# Patient Record
Sex: Female | Born: 1995 | Race: Black or African American | Hispanic: No | Marital: Single | Smoking: Former smoker
Health system: Southern US, Community
[De-identification: ages and names within clinical notes are randomized; demographics above are authoritative.]

---

## 2017-04-14 ENCOUNTER — Emergency Department
Admission: EM | Admit: 2017-04-14 | Discharge: 2017-04-14 | Disposition: A | Discharge status: Home / Self Care | Payer: Medicaid Other | Attending: Emergency Medicine | Admitting: Emergency Medicine

## 2017-04-14 ENCOUNTER — Encounter: Payer: Self-pay | Admitting: Emergency Medicine

## 2017-04-14 DIAGNOSIS — R05 Cough: Secondary | ICD-10-CM | POA: Diagnosis present

## 2017-04-14 DIAGNOSIS — Z87891 Personal history of nicotine dependence: Secondary | ICD-10-CM | POA: Insufficient documentation

## 2017-04-14 DIAGNOSIS — J209 Acute bronchitis, unspecified: Secondary | ICD-10-CM

## 2017-04-14 MED ORDER — AZITHROMYCIN 250 MG PO TABS: ORAL_TABLET | ORAL | 0 refills | Status: AC

## 2017-04-14 MED ORDER — IPRATROPIUM-ALBUTEROL 0.5-2.5 (3) MG/3ML IN SOLN
RESPIRATORY_TRACT | Status: AC
  Filled 2017-04-14: qty 3

## 2017-04-14 MED ORDER — PREDNISONE 10 MG (21) PO TBPK: ORAL_TABLET | Freq: Every day | ORAL | 0 refills | Status: AC

## 2017-04-14 MED ORDER — IPRATROPIUM-ALBUTEROL 0.5-2.5 (3) MG/3ML IN SOLN
3.0000 mL | Freq: Once | RESPIRATORY_TRACT | Status: AC
  Administered 2017-04-14: 3 mL via RESPIRATORY_TRACT

## 2017-04-14 MED ORDER — ALBUTEROL SULFATE HFA 108 (90 BASE) MCG/ACT IN AERS: 2.0000 | INHALATION_SPRAY | Freq: Four times a day (QID) | RESPIRATORY_TRACT | 2 refills | Status: AC | PRN

## 2017-04-14 NOTE — ED Triage Notes (Signed)
Pt to ed with c/o cough x 3 days, worsening today.  Pt denies fever.

## 2017-04-14 NOTE — ED Provider Notes (Signed)
[facility] Emergency Department Provider Note       Time seen: ----------------------------------------- 12:32 PM on 04/14/2017 -----------------------------------------     I have reviewed the triage vital signs and the nursing notes.   HISTORY   Chief Complaint Cough    HPI Tracy Gaines is a 21 y.o. female who presents to the ED for worsening cough for the last 3 days with shortness of breath. Patient states she feels like she is having a hard time breathing. She denies fevers or chills, head congestion with sputum production and cough. She states she had childhood asthma but denies inhaler use in the past.   History reviewed. No pertinent past medical history.  There are no active problems to display for this patient.   History reviewed. No pertinent surgical history.  Allergies Patient has no known allergies.  Social History Social History  Substance Use Topics  . Smoking status: Former Games developer  . Smokeless tobacco: Never Used  . Alcohol use Yes    Review of Systems Constitutional: Negative for fever. Eyes: Negative for vision changes ENT:  Positive for congestion Cardiovascular: Negative for chest pain. Respiratory: Positive for shortness of breath and cough Gastrointestinal: Negative for abdominal pain, vomiting and diarrhea. Genitourinary: Negative for dysuria. Musculoskeletal: Negative for back pain. Skin: Negative for rash. Neurological: Negative for headaches, focal weakness or numbness.  All systems negative/normal/unremarkable except as stated in the HPI  ____________________________________________   PHYSICAL EXAM:  VITAL SIGNS: ED Triage Vitals [04/14/17 1147]  Enc Vitals Group     BP (!) 142/81     Pulse Rate 83     Resp 18     Temp 97.5 F (36.4 C)     Temp Source Oral     SpO2 97 %     Weight 206 lb (93.4 kg)     Height 5\' 3"  (1.6 m)     Head Circumference      Peak Flow      Pain Score 0     Pain  Loc      Pain Edu?      Excl. in GC?     Constitutional: Alert and oriented. Well appearing and in no distress. Eyes: Conjunctivae are normal. Normal extraocular movements. ENT   Head: Normocephalic and atraumatic.   Nose: No congestion/rhinnorhea.   Mouth/Throat: Mucous membranes are moist.   Neck: No stridor. Cardiovascular: Normal rate, regular rhythm. No murmurs, rubs, or gallops. Respiratory: Normal respiratory effort without tachypnea nor retractions. Mild expiratory wheezing on the right Gastrointestinal: Soft and nontender. Normal bowel sounds Musculoskeletal: Nontender with normal range of motion in extremities. No lower extremity tenderness nor edema. Neurologic:  Normal speech and language. No gross focal neurologic deficits are appreciated.  Skin:  Skin is warm, dry and intact. No rash noted. Psychiatric: Mood and affect are normal. Speech and behavior are normal.  ___________________________________________  ED COURSE:  Pertinent labs & imaging results that were available during my care of the patient were reviewed by me and considered in my medical decision making (see chart for details). Patient presents for cough with bronchospasm, we will assess with labs and imaging as indicated.   Procedures ____________________________________________  FINAL ASSESSMENT AND PLAN  Cough, bronchospasm  Plan: Patient had presented for cough and wheezing. We gave her a DuoNeb which improved her breathing. She'll be discharged with albuterol and steroids. Symptoms are likely viral, advised if the cough is no better in a week to start antibiotics.   Daryel November  E, MD   Note: This note was generated in part or whole with voice recognition software. Voice recognition is usually quite accurate but there are transcription errors that can and very often do occur. I apologize for any typographical errors that were not detected and corrected.     Emily FilbertWilliams, Jonathan  E, MD 04/14/17 [phone]

## 2017-04-14 NOTE — ED Notes (Signed)
Pt states cough, chills, SOB x few days. States she might be congested. Has not checked temp at home. Alert, oriented, ambulatory. No distress noted.

## 2017-09-21 ENCOUNTER — Emergency Department
Admission: EM | Admit: 2017-09-21 | Discharge: 2017-09-21 | Disposition: A | Discharge status: Home / Self Care | Payer: Self-pay | Attending: Emergency Medicine | Admitting: Emergency Medicine

## 2017-09-21 ENCOUNTER — Emergency Department: Payer: Self-pay

## 2017-09-21 DIAGNOSIS — R1031 Right lower quadrant pain: Secondary | ICD-10-CM | POA: Insufficient documentation

## 2017-09-21 DIAGNOSIS — Z79899 Other long term (current) drug therapy: Secondary | ICD-10-CM | POA: Insufficient documentation

## 2017-09-21 DIAGNOSIS — Z87891 Personal history of nicotine dependence: Secondary | ICD-10-CM | POA: Insufficient documentation

## 2017-09-21 DIAGNOSIS — R197 Diarrhea, unspecified: Secondary | ICD-10-CM | POA: Insufficient documentation

## 2017-09-21 DIAGNOSIS — R1030 Lower abdominal pain, unspecified: Secondary | ICD-10-CM

## 2017-09-21 LAB — URINALYSIS, COMPLETE (UACMP) WITH MICROSCOPIC
Bacteria, UA: NONE SEEN
Bilirubin Urine: NEGATIVE
GLUCOSE, UA: NEGATIVE mg/dL
Ketones, ur: NEGATIVE mg/dL
NITRITE: NEGATIVE
PH: 5 (ref 5.0–8.0)
Protein, ur: 30 mg/dL — AB
SPECIFIC GRAVITY, URINE: 1.023 (ref 1.005–1.030)

## 2017-09-21 LAB — COMPREHENSIVE METABOLIC PANEL
ALK PHOS: 46 U/L (ref 38–126)
ALT: 20 U/L (ref 14–54)
AST: 21 U/L (ref 15–41)
Albumin: 4.2 g/dL (ref 3.5–5.0)
Anion gap: 6 (ref 5–15)
BILIRUBIN TOTAL: 0.9 mg/dL (ref 0.3–1.2)
BUN: 11 mg/dL (ref 6–20)
CALCIUM: 8.9 mg/dL (ref 8.9–10.3)
CO2: 24 mmol/L (ref 22–32)
CREATININE: 0.74 mg/dL (ref 0.44–1.00)
Chloride: 108 mmol/L (ref 101–111)
GFR calc Af Amer: >60 mL/min (ref >60)
Glucose, Bld: 98 mg/dL (ref 65–99)
Potassium: 3.7 mmol/L (ref 3.5–5.1)
SODIUM: 138 mmol/L (ref 135–145)
TOTAL PROTEIN: 7 g/dL (ref 6.5–8.1)

## 2017-09-21 LAB — CBC
HCT: 38.6 % (ref 35.0–47.0)
Hemoglobin: 12.9 g/dL (ref 12.0–16.0)
MCH: 26.1 pg (ref 26.0–34.0)
MCHC: 33.4 g/dL (ref 32.0–36.0)
MCV: 78.3 fL — ABNORMAL LOW (ref 80.0–100.0)
PLATELETS: 210 10*3/uL (ref 150–440)
RBC: 4.94 MIL/uL (ref 3.80–5.20)
RDW: 13.6 % (ref 11.5–14.5)
WBC: 11.7 10*3/uL — ABNORMAL HIGH (ref 3.6–11.0)

## 2017-09-21 LAB — POCT PREGNANCY, URINE: Preg Test, Ur: NEGATIVE

## 2017-09-21 LAB — LIPASE, BLOOD: Lipase: 21 U/L (ref 11–51)

## 2017-09-21 MED ORDER — ONDANSETRON HCL 4 MG PO TABS: 4.0000 mg | ORAL_TABLET | Freq: Three times a day (TID) | ORAL | 0 refills | Status: AC | PRN

## 2017-09-21 MED ORDER — ONDANSETRON HCL 4 MG/2ML IJ SOLN
4.0000 mg | Freq: Once | INTRAMUSCULAR | Status: AC
  Administered 2017-09-21: 4 mg via INTRAVENOUS
  Filled 2017-09-21: qty 2

## 2017-09-21 MED ORDER — KETOROLAC TROMETHAMINE 30 MG/ML IJ SOLN
15.0000 mg | Freq: Once | INTRAMUSCULAR | Status: AC
  Administered 2017-09-21: 15 mg via INTRAVENOUS
  Filled 2017-09-21: qty 1

## 2017-09-21 MED ORDER — IOPAMIDOL (ISOVUE-300) INJECTION 61%
100.0000 mL | Freq: Once | INTRAVENOUS | Status: AC | PRN
  Administered 2017-09-21: 100 mL via INTRAVENOUS
  Filled 2017-09-21: qty 100

## 2017-09-21 NOTE — ED Triage Notes (Signed)
Lower abdominal cramping and intermittent sharp pain that began last night. Nausea and diarrhea X 2. Pt alert and oriented X4, active, cooperative, pt in NAD. RR even and unlabored, color WNL.

## 2017-09-21 NOTE — ED Triage Notes (Signed)
FIRST NURSE NOTE-offered wheel chair, declined. C/o abdominal pain. No distress, appears to be hurting.

## 2017-09-21 NOTE — ED Provider Notes (Signed)
[facility] Emergency Department Provider Note  ____________________________________________  Time seen: Approximately 12:42 PM  I have reviewed the triage vital signs and the nursing notes.   HISTORY  Chief Complaint Abdominal Pain   HPI Tracy Gaines is a 21 y.o. female no significant past medical history who presents for evaluation of abdominal pain. Patient is complaining of lower abdominal pain that she describes as crampy and sharp, constant, worse on the right lower quadrant that started last night. She has had nausea and 3 episodes of watery diarrhea. No vomiting, no melena, no hematochezia, no fever or chills. Patient denies any prior abdominal surgeries. Her pain is currently moderate. No dysuria or hematuria, no vaginal discharge.  History reviewed. No pertinent past medical history.  There are no active problems to display for this patient.   History reviewed. No pertinent surgical history.  Prior to Admission medications   Medication Sig Start Date End Date Taking? Authorizing Provider  albuterol (PROVENTIL HFA;VENTOLIN HFA) 108 (90 Base) MCG/ACT inhaler Inhale 2 puffs into the lungs every 6 (six) hours as needed for wheezing or shortness of breath. 04/14/17   Emily Filbert, MD  azithromycin (ZITHROMAX Z-PAK) 250 MG tablet Take 2 tablets (500 mg) on  Day 1,  followed by 1 tablet (250 mg) once daily on Days 2 through 5.  Only take this if you are still coughing in a week 04/14/17   Emily Filbert, MD  ondansetron (ZOFRAN) 4 MG tablet Take 1 tablet (4 mg total) by mouth every 8 (eight) hours as needed for nausea or vomiting. 09/21/17   Don Perking, [place], MD  predniSONE (STERAPRED UNI-PAK 21 TAB) 10 MG (21) TBPK tablet Take by mouth daily. Dispense steroid taper pack as directed 04/14/17   Emily Filbert, MD    Allergies Patient has no known allergies.  No family history on file.  Social History Social History  Substance  Use Topics  . Smoking status: Former Games developer  . Smokeless tobacco: Never Used  . Alcohol use Yes    Review of Systems  Constitutional: Negative for fever. Eyes: Negative for visual changes. ENT: Negative for sore throat. Neck: No neck pain  Cardiovascular: Negative for chest pain. Respiratory: Negative for shortness of breath. Gastrointestinal: + lower abdominal pain, nausea, and diarrhea. No vomiting  Genitourinary: Negative for dysuria. Musculoskeletal: Negative for back pain. Skin: Negative for rash. Neurological: Negative for headaches, weakness or numbness. Psych: No SI or HI  ____________________________________________   PHYSICAL EXAM:  VITAL SIGNS: ED Triage Vitals  Enc Vitals Group     BP 09/21/17 0914 (!) 133/43     Pulse Rate 09/21/17 0914 89     Resp 09/21/17 0914 16     Temp 09/21/17 0914 99.2 F (37.3 C)     Temp Source 09/21/17 0914 Oral     SpO2 09/21/17 0914 95 %     Weight 09/21/17 0915 210 lb (95.3 kg)     Height 09/21/17 0915 5\' 3"  (1.6 m)     Head Circumference --      Peak Flow --      Pain Score 09/21/17 0914 6     Pain Loc --      Pain Edu? --      Excl. in GC? --     Constitutional: Alert and oriented. Well appearing and in no apparent distress. HEENT:      Head: Normocephalic and atraumatic.         Eyes: Conjunctivae are  normal. Sclera is non-icteric.       Mouth/Throat: Mucous membranes are moist.       Neck: Supple with no signs of meningismus. Cardiovascular: Regular rate and rhythm. No murmurs, gallops, or rubs. 2+ symmetrical distal pulses are present in all extremities. No JVD. Respiratory: Normal respiratory effort. Lungs are clear to auscultation bilaterally. No wheezes, crackles, or rhonchi.  Gastrointestinal: Soft, patient is ttp over all lower quadrants but more pronounced in the RLQ, and non distended with positive bowel sounds. No rebound or guarding. Musculoskeletal: Nontender with normal range of motion in all  extremities. No edema, cyanosis, or erythema of extremities. Neurologic: Normal speech and language. Face is symmetric. Moving all extremities. No gross focal neurologic deficits are appreciated. Skin: Skin is warm, dry and intact. No rash noted. Psychiatric: Mood and affect are normal. Speech and behavior are normal.  ____________________________________________   LABS (all labs ordered are listed, but only abnormal results are displayed)  Labs Reviewed  CBC - Abnormal; Notable for the following:       Result Value   WBC 11.7 (*)    MCV 78.3 (*)    All other components within normal limits  URINALYSIS, COMPLETE (UACMP) WITH MICROSCOPIC - Abnormal; Notable for the following:    Color, Urine YELLOW (*)    APPearance HAZY (*)    Hgb urine dipstick LARGE (*)    Protein, ur 30 (*)    Leukocytes, UA MODERATE (*)    Squamous Epithelial / LPF 0-5 (*)    All other components within normal limits  URINE CULTURE  LIPASE, BLOOD  COMPREHENSIVE METABOLIC PANEL  POC URINE PREG, ED  POCT PREGNANCY, URINE   ____________________________________________  EKG  none  ____________________________________________  RADIOLOGY  CT a/p: 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 No other abnormality seen in the abdomen or pelvis. ____________________________________________   PROCEDURES  Procedure(s) performed: None Procedures Critical Care performed:  None ____________________________________________   INITIAL IMPRESSION / ASSESSMENT AND PLAN / ED COURSE  21 y.o. female no significant past medical history who presents for evaluation of abdominal pain, nausea, and diarrhea since last night. Patient is extremely well appearing, no distress, vital signs are within normal limits with a temp of 99.61F. labs show slight leukocytosis with white count of 11.7. CMP, lipase, pregnancy are all negative. UA has WBCs and  leukocytes but no nitrites or bacteria. Patient has no vaginal discharge or dysuria so will not treat at this time and will send urine for culture. She is tender diffusely in the lower quadrants however since her ttp is more pronounced in the RLQ, she has leukocytosis and low grade temp, I will send her for CT to rule out appendicitis. Since patient has diarrhea this could also be gastroenteritis or colitis.     _________________________ 1:04 PM on 09/21/2017 -----------------------------------------  CT showing pelvic congestion syndrome but no evidence of appendicitis, colitis, or diverticulitis. Pain has resolved with IV Toradol. Abdomen remains soft and benign now with no tenderness. Presentation concerning for most likely viral gastritis. We'll discharge home with Zofran, close follow-up with PCP and discussed return precautions.   As part of my medical decision making, I reviewed the following data within the electronic MEDICAL RECORD NUMBER Nursing notes reviewed and incorporated, Labs reviewed , Radiograph reviewed , Notes from prior ED visits and [place] Controlled Substance Database    Pertinent labs & imaging results that were available during my care of the  patient were reviewed by me and considered in my medical decision making (see chart for details).    ____________________________________________   FINAL CLINICAL IMPRESSION(S) / ED DIAGNOSES  Final diagnoses:  Lower abdominal pain  Diarrhea of presumed infectious origin      NEW MEDICATIONS STARTED DURING THIS VISIT:  New Prescriptions   ONDANSETRON (ZOFRAN) 4 MG TABLET    Take 1 tablet (4 mg total) by mouth every 8 (eight) hours as needed for nausea or vomiting.     Note:  This document was prepared using Dragon voice recognition software and may include unintentional dictation errors.    Nita Sickle, MD 09/21/17 [phone]

## 2017-09-21 NOTE — ED Notes (Signed)
Pt verbalized understanding of discharge instructions. NAD at this time. 

## 2017-09-21 NOTE — Discharge Instructions (Signed)

## 2017-09-23 LAB — URINE CULTURE

## 2018-08-25 IMAGING — CT CT ABD-PELV W/ CM
2 of 4 series · 16 of 46 positions shown, 18 images · IV contrast (iopamidol)
Comparison: None.

CLINICAL DATA: Acute lower abdominal pain.

EXAM:
CT ABDOMEN AND PELVIS WITH CONTRAST
TECHNIQUE: Multidetector CT imaging of the abdomen and pelvis was performed
using the standard protocol following bolus administration of
intravenous contrast.
CONTRAST:  100mL 0ZXHI7-HCC IOPAMIDOL (0ZXHI7-HCC) INJECTION 61%

[Series 2: axial st · axial · 0.76mm/px · z∈[-971,-546]mm · 13 of 93 slices shown, 15 images]
[im 4/93  soft-tissue]
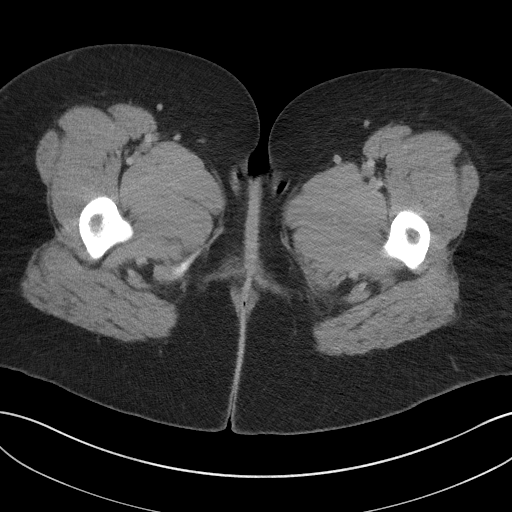
[im 4/93  bone]
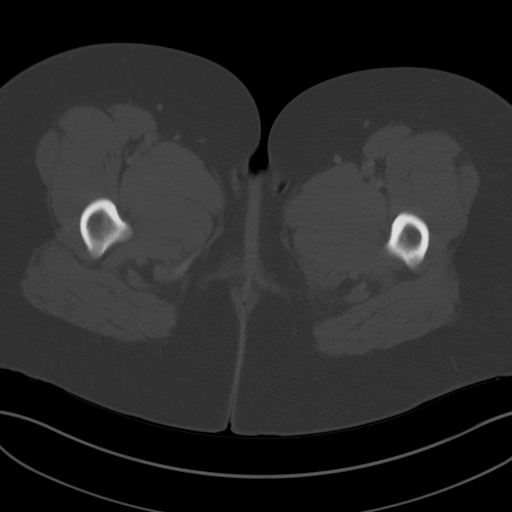
[im 12/93  soft-tissue]
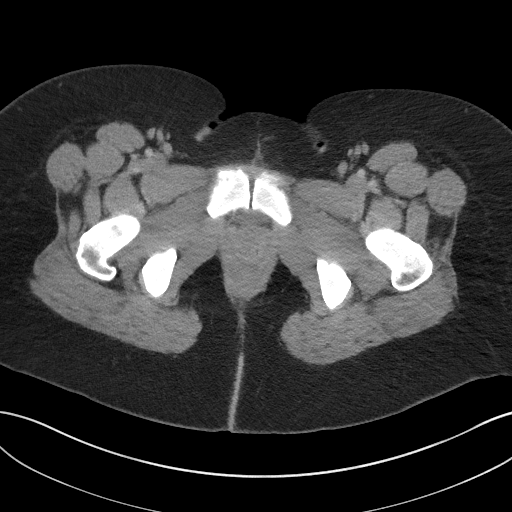
[im 20/93  soft-tissue]
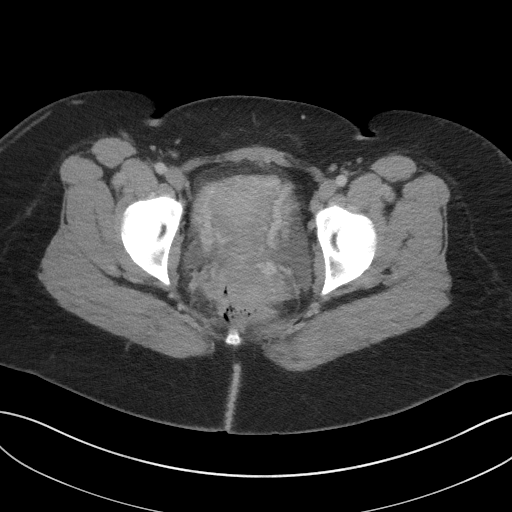
[im 27/93  soft-tissue]
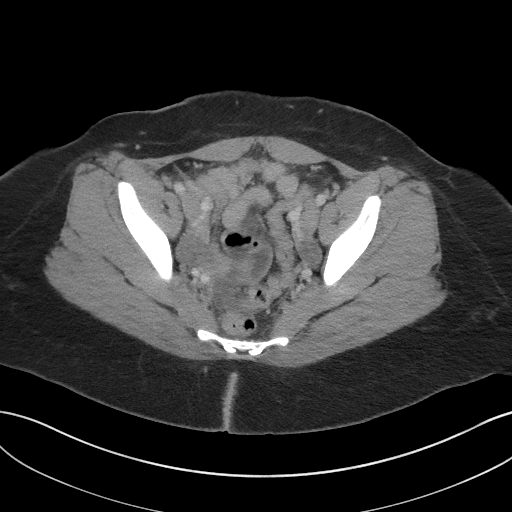
[im 31/93  soft-tissue]
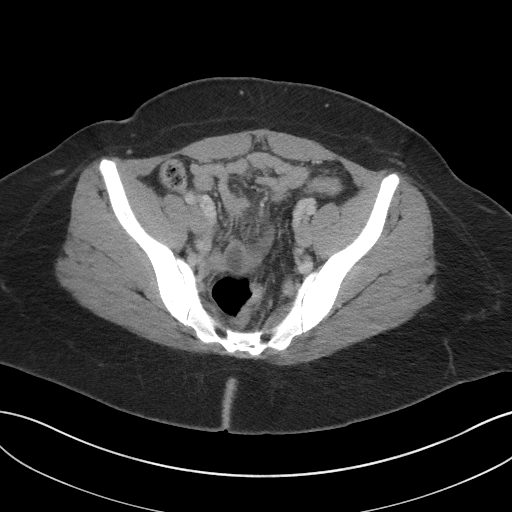
[im 39/93  soft-tissue]
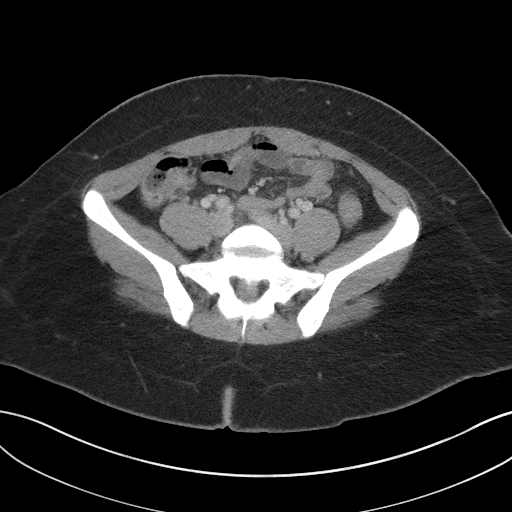
[im 47/93  soft-tissue]
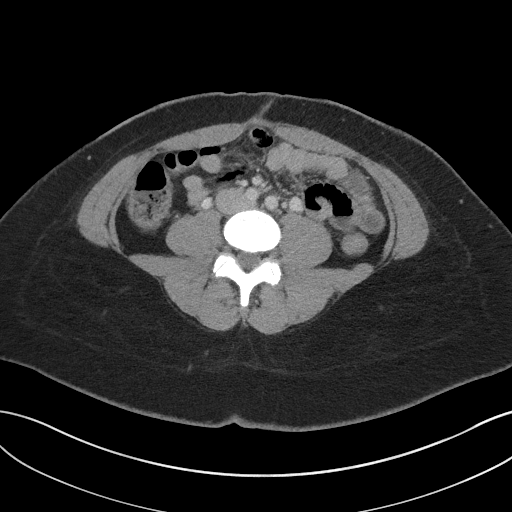
[im 54/93  soft-tissue]
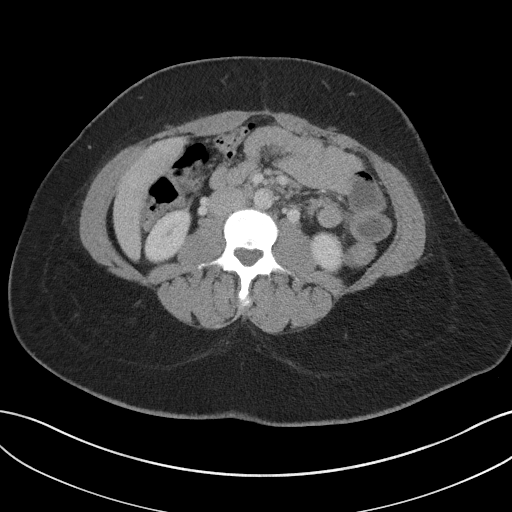
[im 62/93  soft-tissue]
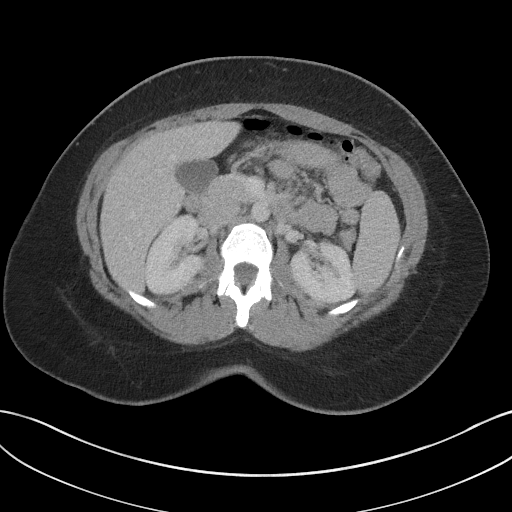
[im 62/93  bone]
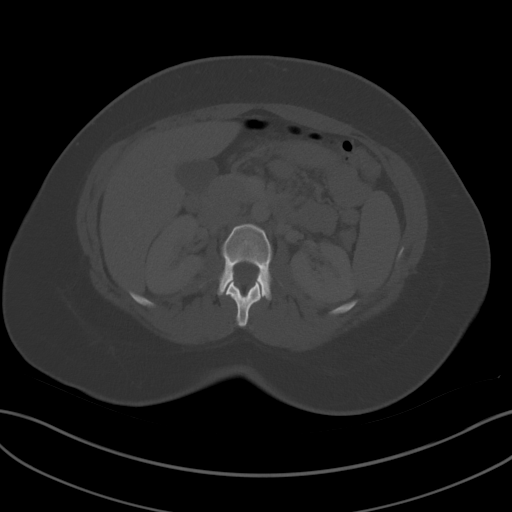
[im 66/93  soft-tissue]
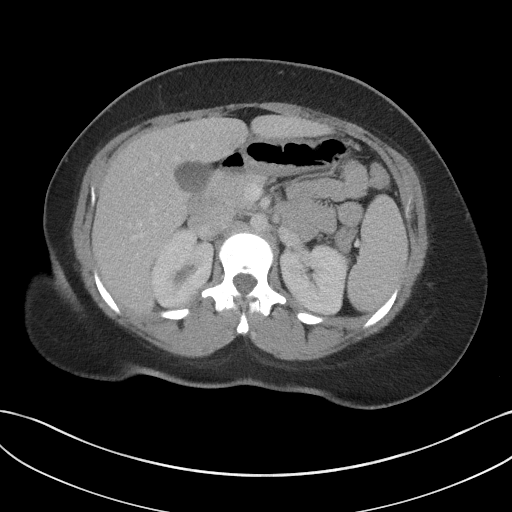
[im 73/93  soft-tissue]
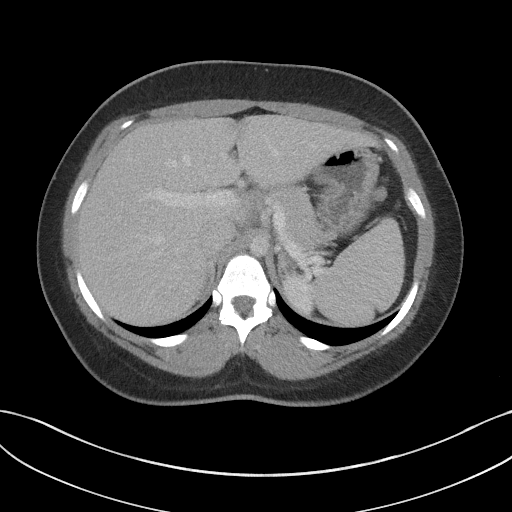
[im 81/93  soft-tissue]
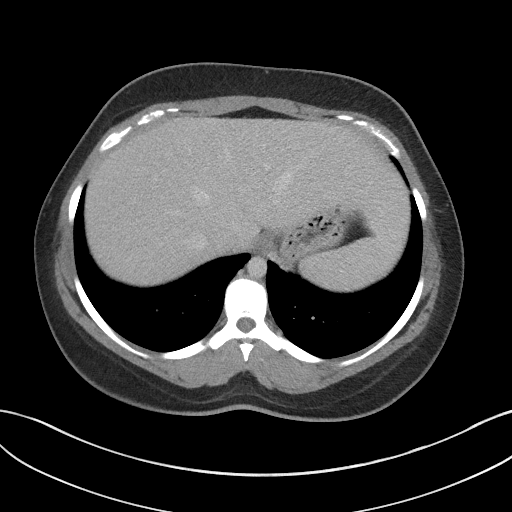
[im 89/93  soft-tissue]
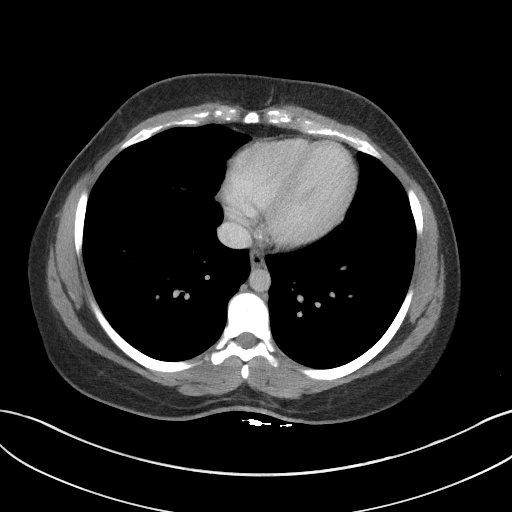

[Series 5: coronal st · coronal · 0.72mm/px · 3 of 77 slices shown]
[im 26/77  soft-tissue]
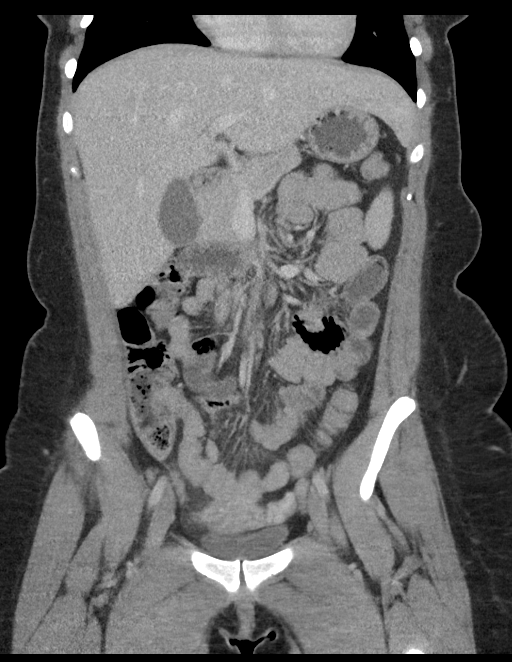
[im 34/77  soft-tissue]
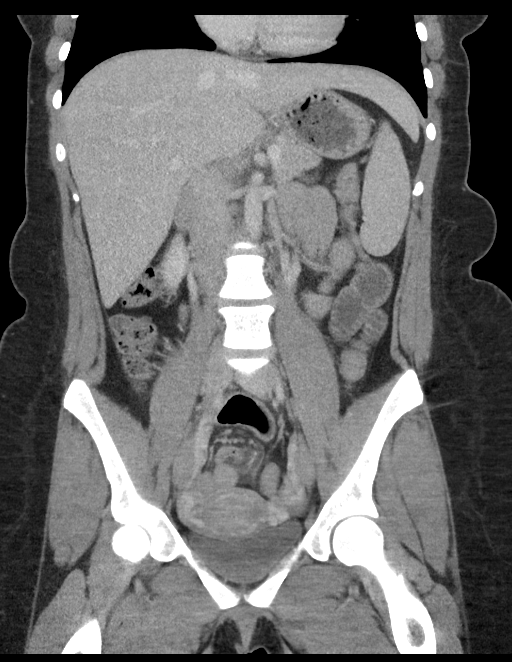
[im 43/77  soft-tissue]
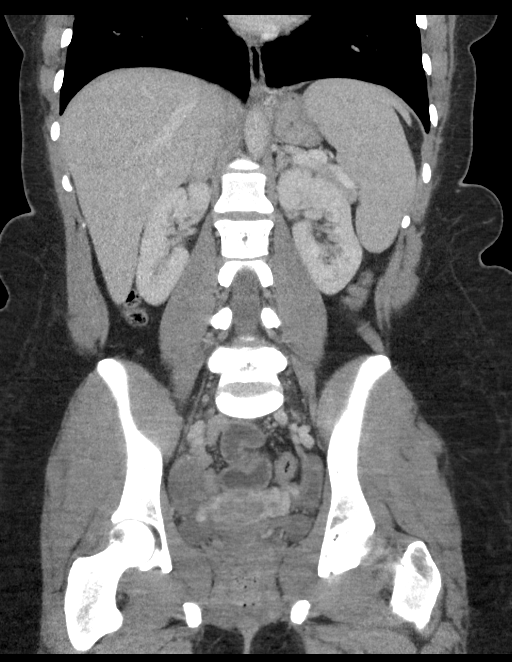

[16 of 46 positions shown; findings below may reference images not displayed]

FINDINGS: Lower chest: No acute abnormality.

Hepatobiliary: No focal liver abnormality is seen. No gallstones,
gallbladder wall thickening, or biliary dilatation.

Pancreas: Unremarkable. No pancreatic ductal dilatation or
surrounding inflammatory changes.

Spleen: Normal in size without focal abnormality.

Adrenals/Urinary Tract: Adrenal glands are unremarkable. Kidneys are
normal, without renal calculi, focal lesion, or hydronephrosis.
Bladder is unremarkable.

Stomach/Bowel: Stomach is within normal limits. Appendix appears
normal. No evidence of bowel wall thickening, distention, or
inflammatory changes.

Vascular/Lymphatic: No significant adenopathy is noted. Enlarged
pelvic varices are noted bilaterally in the pelvis with enlarged
ovarian veins most consistent with venous valvular incompetence.

Reproductive: Uterus and bilateral adnexa are unremarkable.

Other: No abdominal wall hernia or abnormality. No abdominopelvic
ascites.

Musculoskeletal: No acute or significant osseous findings.
IMPRESSION: Enlarged bilateral pelvic varices are noted with large ovarian veins
most consistent with pelvic congestion syndrome. No other
abnormality seen in the abdomen or pelvis.

## 2018-11-08 ENCOUNTER — Ambulatory Visit: Payer: Self-pay | Admitting: Internal Medicine

## 2018-12-13 ENCOUNTER — Ambulatory Visit: Payer: Self-pay | Admitting: Internal Medicine
